# Patient Record
Sex: Male | Born: 1980
Health system: Southern US, Community
[De-identification: ages and names within clinical notes are randomized; demographics above are authoritative.]

---

## 2003-05-05 ENCOUNTER — Emergency Department (HOSPITAL_COMMUNITY): Admission: EM | Admit: 2003-05-05 | Discharge: 2003-05-05 | Payer: Self-pay | Admitting: Emergency Medicine

## 2003-05-05 ENCOUNTER — Encounter: Payer: Self-pay | Admitting: Emergency Medicine

## 2009-06-16 ENCOUNTER — Encounter (INDEPENDENT_AMBULATORY_CARE_PROVIDER_SITE_OTHER): Payer: Self-pay | Admitting: *Deleted

## 2012-06-14 ENCOUNTER — Encounter: Payer: Self-pay | Admitting: Family Medicine

## 2012-06-14 ENCOUNTER — Ambulatory Visit (INDEPENDENT_AMBULATORY_CARE_PROVIDER_SITE_OTHER): Payer: BC Managed Care – PPO | Admitting: Family Medicine

## 2012-06-14 VITALS — BP 136/102 | HR 98 | Temp 98.8°F | Resp 18 | Ht 68.5 in | Wt 161.2 lb

## 2012-06-14 DIAGNOSIS — E860 Dehydration: Secondary | ICD-10-CM

## 2012-06-14 DIAGNOSIS — F329 Major depressive disorder, single episode, unspecified: Secondary | ICD-10-CM

## 2012-06-14 DIAGNOSIS — R1110 Vomiting, unspecified: Secondary | ICD-10-CM

## 2012-06-14 DIAGNOSIS — F323 Major depressive disorder, single episode, severe with psychotic features: Secondary | ICD-10-CM

## 2012-06-14 LAB — POCT CBC
Granulocyte percent: 59.4 %G (ref 37–80)
Hemoglobin: 15.2 g/dL (ref 14.1–18.1)
Lymph, poc: 1.7 (ref 0.6–3.4)
MCH, POC: 22.1 pg — AB (ref 27–31.2)
MCHC: 30.8 g/dL — AB (ref 31.8–35.4)
MCV: 71.9 fL — AB (ref 80–97)
MPV: 10.2 fL (ref 0–99.8)
POC Granulocyte: 3.1 (ref 2–6.9)
RBC: 6.87 M/uL — AB (ref 4.69–6.13)
WBC: 5.3 10*3/uL (ref 4.6–10.2)

## 2012-06-14 LAB — POCT URINALYSIS DIPSTICK
Bilirubin, UA: NEGATIVE
Blood, UA: NEGATIVE
Leukocytes, UA: NEGATIVE
Protein, UA: 100
Spec Grav, UA: 1.02
Urobilinogen, UA: 0.2
pH, UA: 7.5

## 2012-06-14 LAB — POCT UA - MICROSCOPIC ONLY

## 2012-06-14 LAB — TSH: TSH: 2.081 u[IU]/mL (ref 0.350–4.500)

## 2012-06-14 LAB — COMPREHENSIVE METABOLIC PANEL
Albumin: 5 g/dL (ref 3.5–5.2)
BUN: 10 mg/dL (ref 6–23)
CO2: 26 mEq/L (ref 19–32)
Calcium: 9.8 mg/dL (ref 8.4–10.5)
Chloride: 96 mEq/L (ref 96–112)
Sodium: 138 mEq/L (ref 135–145)

## 2012-06-14 LAB — AMYLASE: Amylase: 34 U/L (ref 0–105)

## 2012-06-14 MED ORDER — ONDANSETRON 8 MG PO TBDP: 8.0000 mg | ORAL_TABLET | Freq: Three times a day (TID) | ORAL | Status: DC | PRN

## 2012-06-14 MED ORDER — ONDANSETRON 4 MG PO TBDP
8.0000 mg | ORAL_TABLET | Freq: Once | ORAL | Status: AC
  Administered 2012-06-14: 8 mg via ORAL

## 2012-06-14 MED ORDER — SERTRALINE HCL 50 MG PO TABS: 50.0000 mg | ORAL_TABLET | Freq: Every day | ORAL | Status: DC

## 2012-06-14 MED ORDER — PROMETHAZINE HCL 25 MG PO TABS: 25.0000 mg | ORAL_TABLET | Freq: Three times a day (TID) | ORAL | Status: DC | PRN

## 2012-06-14 NOTE — Patient Instructions (Addendum)
1. Vomiting  POCT urinalysis dipstick, POCT UA - Microscopic Only, POCT CBC, Comprehensive metabolic panel, Amylase, Lipase, ondansetron (ZOFRAN-ODT) disintegrating tablet 8 mg, promethazine (PHENERGAN) 25 MG tablet, DISCONTINUED: ondansetron (ZOFRAN-ODT) 8 MG disintegrating tablet  2. Depression, psychotic  TSH, Ambulatory referral to Psychiatry, Ambulatory referral to Psychology, sertraline (ZOLOFT) 50 MG tablet  3. Dehydration     Dehydration, Adult Dehydration is when you lose more fluids from the body than you take in. Vital organs like the kidneys, brain, and heart cannot function without a proper amount of fluids and salt. Any loss of fluids from the body can cause dehydration.  CAUSES   Vomiting.   Diarrhea.   Excessive sweating.   Excessive urine output.   Fever.  SYMPTOMS  Mild dehydration  Thirst.   Dry lips.   Slightly dry mouth.  Moderate dehydration  Very dry mouth.   Sunken eyes.   Skin does not bounce back quickly when lightly pinched and released.   Dark urine and decreased urine production.   Decreased tear production.   Headache.  Severe dehydration  Very dry mouth.   Extreme thirst.   Rapid, weak pulse (more than 100 beats per minute at rest).   Cold hands and feet.   Not able to sweat in spite of heat and temperature.   Rapid breathing.   Blue lips.   Confusion and lethargy.   Difficulty being awakened.   Minimal urine production.   No tears.  DIAGNOSIS  Your caregiver will diagnose dehydration based on your symptoms and your exam. Blood and urine tests will help confirm the diagnosis. The diagnostic evaluation should also identify the cause of dehydration. TREATMENT  Treatment of mild or moderate dehydration can often be done at home by increasing the amount of fluids that you drink. It is best to drink small amounts of fluid more often. Drinking too much at one time can make vomiting worse. Refer to the home care instructions  below. Severe dehydration needs to be treated at the hospital where you will probably be given intravenous (IV) fluids that contain water and electrolytes. HOME CARE INSTRUCTIONS   Ask your caregiver about specific rehydration instructions.   Drink enough fluids to keep your urine clear or pale yellow.   Drink small amounts frequently if you have nausea and vomiting.   Eat as you normally do.   Avoid:   Foods or drinks high in sugar.   Carbonated drinks.   Juice.   Extremely hot or cold fluids.   Drinks with caffeine.   Fatty, greasy foods.   Alcohol.   Tobacco.   Overeating.   Gelatin desserts.   Wash your hands well to avoid spreading bacteria and viruses.   Only take over-the-counter or prescription medicines for pain, discomfort, or fever as directed by your caregiver.   Ask your caregiver if you should continue all prescribed and over-the-counter medicines.   Keep all follow-up appointments with your caregiver.  SEEK MEDICAL CARE IF:  You have abdominal pain and it increases or stays in one area (localizes).   You have a rash, stiff neck, or severe headache.   You are irritable, sleepy, or difficult to awaken.   You are weak, dizzy, or extremely thirsty.  SEEK IMMEDIATE MEDICAL CARE IF:   You are unable to keep fluids down or you get worse despite treatment.   You have frequent episodes of vomiting or diarrhea.   You have blood or green matter (bile) in your vomit.   You  have blood in your stool or your stool looks black and tarry.   You have not urinated in 6 to 8 hours, or you have only urinated a small amount of very dark urine.   You have a fever.   You faint.  MAKE SURE YOU:   Understand these instructions.   Will watch your condition.   Will get help right away if you are not doing well or get worse.  Document Released: 10/18/2005 Document Revised: 10/07/2011 Document Reviewed: 06/07/2011 [facility] Patient Information 2012 [place],  [place].

## 2012-06-14 NOTE — Progress Notes (Signed)
Subjective:    Patient ID: Brandon Hayes, male    DOB: 1981/08/25, 31 y.o.   MRN: [id number]  HPIThis 31 y.o. male presents for evaluation of vomiting.  Onset two days ago.  No fever but +chills/sweats.  Mild headache.  No sore throat.  Vomited x multiple.  Vomited x 4 on first night; yesterday vomited x 5; today x 1 today.  Vomitus yellow; non-bloody.  No diarrhea.  Mild abdominal pain.  No urinary symptoms; no hematuria; no dysuria.  Heart racing; arms tingling on way to office; unable to open hands on way to office.  Mild dizziness.  Very weak.  Missed work yesterday; never misses work.  Has drunk 1/2 bottle water this morning; no fluid intake yesterday at all.  Last urine output this am at 8:00am small amount.  Works as Investment banker, operational at [employer]; lots of fluid.  No concern for food poisoning; no foreign travel; no camping. Wife wants pt to mention --- transitions at work; absolutely miserable; hearing voices.  Drinking 4-5 beers per night; skips Sundays.  No drugs.  No tobacco.   Depression: onset of hallucinations/hearing voices three weeks ago; saw picture on playstation; saw picture of wife's Dad and heard voices talking to him.  Not self.  +Losing it.  No SI/HI.  Hears grandfather.  No visual hallucinations.  Growing up grandfather called [employer].  No history of anxiety or depression.  No history of panic attacks but two times in past feeling like choking.  Went to [employer] and started pancking; unable to find wife nad felt like choking.  Duration six months.  Has job offers all the time.  Current employment duration seven years.  Relationship with wife is great; infertility x 2 years.  Wife starting medication for infertility.    No previous history of anxiety or depression.  Voices are not telling patient to harm self or others; just hearing grandfather talking to him regularly.  Work environment very stressful.   PMH:  None Psurg: none Medications:  Fish Oil, Vitamin C. 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   Review of Systems  Constitutional: Positive for chills, activity change, appetite change and fatigue. Negative for fever and unexpected weight change.  HENT: Negative for congestion, rhinorrhea, sneezing and ear discharge.   Eyes: Negative for photophobia and visual disturbance.  Respiratory: Negative for cough and shortness of breath.   Cardiovascular: Positive for palpitations. Negative for chest pain.  Gastrointestinal: Positive for nausea and vomiting. Negative for abdominal pain, diarrhea, constipation, blood in stool, abdominal distention and rectal pain.  Genitourinary: Positive for decreased urine volume. Negative for urgency, frequency, hematuria and flank pain.  Skin: Negative for rash.  Psychiatric/Behavioral: Positive for hallucinations, disturbed wake/sleep cycle and dysphoric mood. Negative for suicidal ideas, confusion, self-injury, decreased concentration and agitation. The patient is nervous/anxious.     History reviewed. No pertinent past medical history.  History reviewed. No pertinent past surgical history.  Prior to Admission medications   Medication Sig Start Date End Date Taking? Authorizing Provider  promethazine (PHENERGAN) 25 MG tablet Take 1 tablet (25 mg total) by mouth every 8 (eight) hours as needed for nausea. 06/14/12 06/21/12  Ethelda Chick, MD  sertraline (ZOLOFT) 50 MG tablet Take 1 tablet (50 mg total) by mouth daily. 06/14/12 06/14/13  Ethelda Chick, MD    No Known Allergies  History   Social History  . Marital Status:  Married    Spouse Name: N/A    Number of Children: N/A  . Years of Education: N/A   Occupational History  . Not on file.   Social History Main Topics  . Smoking status: Never Smoker   . Smokeless tobacco: Not on file  . Alcohol Use: 21.6 oz/week    36 Cans of beer per week  . Drug Use: No  . Sexually Active: Yes    Birth  Control/ Protection: None   Other Topics Concern  . Not on file   Social History Narrative   Married.  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    History reviewed. No pertinent family history.    Objective:   Physical Exam  Nursing note and vitals reviewed. Constitutional: He is oriented to person, place, and time. He appears well-developed and well-nourished. No distress.  HENT:  Head: Normocephalic and atraumatic.  Mouth/Throat: Oropharynx is clear and moist. No oropharyngeal exudate.  Eyes: Conjunctivae and EOM are normal. Pupils are equal, round, and reactive to light. No scleral icterus.  Neck: Normal range of motion. Neck supple. No thyromegaly present.  Cardiovascular: Normal rate, regular rhythm, normal heart sounds and intact distal pulses.  Exam reveals no gallop and no friction rub.   No murmur heard.      Pulse 98.  Pulmonary/Chest: Effort normal and breath sounds normal. No respiratory distress.  Abdominal: Soft. Bowel sounds are normal. He exhibits no distension and no mass. There is no tenderness. There is no rebound and no guarding.  Lymphadenopathy:    He has no cervical adenopathy.  Neurological: He is alert and oriented to person, place, and time.  Skin: Skin is warm and dry. He is not diaphoretic.  Psychiatric: He has a normal mood and affect. His behavior is normal. Judgment and thought content normal.          Results for orders placed in visit on 06/14/12  POCT URINALYSIS DIPSTICK      Component Value Range   Color, UA amber     Clarity, UA clear     Glucose, UA neg     Bilirubin, UA neg     Ketones, UA 15     Spec Grav, UA 1.020     Blood, UA neg     pH, UA 7.5     Protein, UA 100     Urobilinogen, UA 0.2     Nitrite, UA neg     Leukocytes, UA Negative    POCT UA -  MICROSCOPIC ONLY      Component Value Range   WBC, Ur, HPF, POC 0-2     RBC, urine, microscopic 0-1     Bacteria, U Microscopic trace     Mucus, UA small     Epithelial cells, urine per micros 1-2     Crystals, Ur, HPF, POC neg     Casts, Ur, LPF, POC hyaline     Yeast, UA neg    POCT CBC      Component Value Range   WBC 5.3  4.6 - 10.2 K/uL   Lymph, poc 1.7  0.6 - 3.4   POC LYMPH PERCENT 32.6  10 - 50 %L   MID (cbc) 0.4  0 - 0.9   POC MID % 8.0  0 - 12 %M   POC Granulocyte 3.1  2 - 6.9   Granulocyte percent 59.4  37 - 80 %G   RBC 6.87 (*) 4.69 - 6.13 M/uL   Hemoglobin 15.2  14.1 - 18.1 g/dL   HCT, POC 16.1  09.6 - 53.7 %   MCV 71.9 (*) 80 - 97 fL   MCH, POC 22.1 (*) 27 - 31.2 pg   MCHC 30.8 (*) 31.8 - 35.4 g/dL   RDW, POC 04.5     Platelet Count, POC 260  142 - 424 K/uL   MPV 10.2  0 - 99.8 fL   Assessment & Plan:   1. Vomiting  POCT urinalysis dipstick, POCT UA - Microscopic Only, POCT CBC, Comprehensive metabolic panel, Amylase, Lipase, ondansetron (ZOFRAN-ODT) disintegrating tablet 8 mg, promethazine (PHENERGAN) 25 MG tablet, DISCONTINUED: ondansetron (ZOFRAN-ODT) 8 MG disintegrating tablet  2. Depression, psychotic  TSH, Ambulatory referral to Psychiatry, Ambulatory referral to Psychology, sertraline (ZOLOFT) 50 MG tablet  3. Dehydration    1.  Vomiting:  New.  Onset 72 hours ago.  S/p Zofran 8mg  SL in office with no recurrent vomiting.  Rx for Phenergan provided.  BRAT diet, hydration.  RTC inability to hydrate orally.  Benign abdominal exam.  Labs obtained. 2.  Dehydration:  New.  Secondary to vomiting.  S/p Zofran in office.  Observed in office; drank 24 ounces water, apple juice without vomiting.  Orthostatics stable.  If does not significantly improve in upcoming 24 hours, to RTC for iv hydration.  Labs obtained. 3.  Depression with psychosis: new. Onset of auditory hallucinations in past 3 weeks; major work stressors.  Rx for Zoloft provided to start today; refer to  psychiatry and intensive outpatient psychotherapy.  Contracted safety with patient.

## 2012-06-15 ENCOUNTER — Encounter: Payer: Self-pay | Admitting: Family Medicine

## 2012-06-16 ENCOUNTER — Other Ambulatory Visit: Payer: Self-pay | Admitting: Family Medicine

## 2012-06-16 ENCOUNTER — Ambulatory Visit (INDEPENDENT_AMBULATORY_CARE_PROVIDER_SITE_OTHER): Payer: BC Managed Care – PPO | Admitting: Emergency Medicine

## 2012-06-16 VITALS — BP 153/102 | HR 105 | Temp 98.0°F | Resp 20 | Ht 67.0 in | Wt 161.0 lb

## 2012-06-16 DIAGNOSIS — R7989 Other specified abnormal findings of blood chemistry: Secondary | ICD-10-CM

## 2012-06-16 DIAGNOSIS — R209 Unspecified disturbances of skin sensation: Secondary | ICD-10-CM

## 2012-06-16 DIAGNOSIS — IMO0001 Reserved for inherently not codable concepts without codable children: Secondary | ICD-10-CM

## 2012-06-16 DIAGNOSIS — F419 Anxiety disorder, unspecified: Secondary | ICD-10-CM

## 2012-06-16 DIAGNOSIS — F411 Generalized anxiety disorder: Secondary | ICD-10-CM

## 2012-06-16 DIAGNOSIS — F1010 Alcohol abuse, uncomplicated: Secondary | ICD-10-CM

## 2012-06-16 DIAGNOSIS — R202 Paresthesia of skin: Secondary | ICD-10-CM

## 2012-06-16 DIAGNOSIS — G4700 Insomnia, unspecified: Secondary | ICD-10-CM

## 2012-06-16 DIAGNOSIS — R112 Nausea with vomiting, unspecified: Secondary | ICD-10-CM

## 2012-06-16 LAB — POCT CBC
Lymph, poc: 1.6 (ref 0.6–3.4)
MCH, POC: 21.9 pg — AB (ref 27–31.2)
MCHC: 30.3 g/dL — AB (ref 31.8–35.4)
MPV: 10.3 fL (ref 0–99.8)
POC Granulocyte: 3.3 (ref 2–6.9)
POC LYMPH PERCENT: 30.1 %L (ref 10–50)
POC MID %: 9.3 %M (ref 0–12)
RDW, POC: 16.6 %
WBC: 5.4 10*3/uL (ref 4.6–10.2)

## 2012-06-16 LAB — FOLATE RBC

## 2012-06-16 LAB — COMPREHENSIVE METABOLIC PANEL
ALT: 56 U/L — ABNORMAL HIGH (ref 0–53)
Alkaline Phosphatase: 41 U/L (ref 39–117)
Creat: 0.57 mg/dL (ref 0.50–1.35)
Glucose, Bld: 96 mg/dL (ref 70–99)
Sodium: 136 mEq/L (ref 135–145)
Total Bilirubin: 1.3 mg/dL — ABNORMAL HIGH (ref 0.3–1.2)
Total Protein: 7.9 g/dL (ref 6.0–8.3)

## 2012-06-16 MED ORDER — LORAZEPAM 0.5 MG PO TABS: ORAL_TABLET | ORAL | Status: DC

## 2012-06-16 NOTE — Progress Notes (Signed)
Subjective:    Patient ID: Brandon Hayes, male    DOB: [birth year], 31 y.o.   MRN: [id number]  HPI This 31 y.o. male presents for evaluation 48 hour follow-up for the following:  1.  Nausea/vomiting:  Vomiting much improved after last visit 48 hours ago; minimal nausea yesterday; ate baked chicken one piece, water and gatorade; + 3 pints of water.  +Vodka last night; 1/5 and a beer.  Drinks every night for insomnia.  Vomited in shower this morning; first episode of vomiting in 48 hours.  Ate a chicken sandwich last night.   Worked yesterday; went into work at [employer]; was tired.  Left work around 6:00pm.  Has been overly tired.  Was in bed for five days in past week.  Now still very tired.   LFTs elevated on labs at visit 48 hours ago; amylase and lipase normal; WBC count normal. 2.  Alcohol intake: usually drinks 1/5 of liquor nightly except on Sundays; has worsened in past several months (six).   Starts drinking around 4:00pm on empty stomach; usually will pass out at night around 7:00pm.  Previously hospitalized for pancreatitis from alcohol; major partier at that time; would drink 2 fifths at night; duration one year; age 12.  No alcoholism in family; parents do not drink.  Wife gets home around 6:00pm to drunk husband who passes out within the hour.  Drinking due to insomnia; feels that if he could sleep he would not need to drink.  Did not drink after visit two days ago, did not sleep all night.  Slept during the day after visit due to Phenergan use. 3.  Anxiety/depression:  Lots of work stressors.  Infertility issues with wife for two years.  Schedule has worsened at work.  Everything negative; nothing positive to day.  Hearing voices for past 3 weeks; not negative voices.  Married x 9 years.  Panic attack this week.  Wife with depression/anxiety/panic attacks.  Isolating self.  Does not want to be around family or friends.  Wants to stay at home.  Felt weird this morning upon awakening; felt very  different.  Wife does not think that medication is causing current symptoms; pt thinks that current symptoms due to anxiety related to work.  Looked ghostly this morning upon awakening; felt very weird; did not want to talk to wife.  Told wife that she had to quit worrying.  Not sure if medication cause of feelings/emotions today.  Drive to work was [employer]; felt tunneling.  Went in at 10:00am; felt different but was determined to work through it.  At noon, felt like was going to fall and developed tingling in fingers and legs.  Everyone is very concerned about him; coworkers very worried.  Now embarrassed.  Tingling now better.  Became very clammy. Started shaking.  Started crying out of nowhere.  Does not want to feel like this anymore.  Has rage modes and hit something while drinking.  Yells at wife when she gets home.    Denies SI/HI today. Wife has never seen him like this.. 4.  Tingling:  Occurred on way to visit 48 hours ago and improved.  Recurred today when at work and crying.  Continued until presenting here; now much improved; when wife picked up patient at work, he was breathing shallow rapid breaths.  No associated headache, focal weakness, incontinence, visual changes, seizure activity.     Review of Systems  Constitutional: Positive for fever and activity change. Negative for chills, diaphoresis and  appetite change.  Eyes: Negative for visual disturbance.  Respiratory: Positive for choking. Negative for chest tightness, shortness of breath, wheezing and stridor.   Cardiovascular: Negative for chest pain and palpitations.  Gastrointestinal: Positive for nausea and vomiting. Negative for abdominal pain, diarrhea, constipation, blood in stool and abdominal distention.  Skin: Negative for rash.  Neurological: Positive for numbness. Negative for dizziness, tremors, seizures, syncope, facial asymmetry, speech difficulty, weakness, light-headedness and headaches.  Psychiatric/Behavioral: Positive for  hallucinations, disturbed wake/sleep cycle, dysphoric mood and agitation. Negative for suicidal ideas, behavioral problems, confusion, self-injury and decreased concentration. The patient is nervous/anxious. The patient is not hyperactive.     No past medical history on file.  No past surgical history on file.  Prior to Admission medications   Medication Sig Start Date End Date Taking? Authorizing Provider  promethazine (PHENERGAN) 25 MG tablet Take 1 tablet (25 mg total) by mouth every 8 (eight) hours as needed for nausea. 06/14/12 06/21/12 Yes Ethelda Chick, MD  sertraline (ZOLOFT) 50 MG tablet Take 1 tablet (50 mg total) by mouth daily. 06/14/12 06/14/13 Yes Ethelda Chick, MD  LORazepam (ATIVAN) 0.5 MG tablet 1-2 tablets three times daily PRN 06/16/12   Ethelda Chick, MD    No Known Allergies  History   Social History  . Marital Status: Married    Spouse Name: N/A    Number of Children: N/A  . Years of Education: N/A   Occupational History  . Not on file.   Social History Main Topics  . Smoking status: Never Smoker   . Smokeless tobacco: Not on file  . Alcohol Use: 21.6 oz/week    36 Cans of beer per week  . Drug Use: No  . Sexually Active: Yes    Birth Control/ Protection: None   Other Topics Concern  . Not on file   Social History Narrative   Married.  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    No family history on file.    Objective:   Physical Exam  Constitutional: He is oriented to person, place, and time. He appears well-developed and well-nourished. He appears distressed.       MILD DISTRESS.  HENT:  Head: Normocephalic and atraumatic.  Eyes: Conjunctivae and EOM are normal. Pupils are equal, round, and reactive to light.  Neck: Normal range of motion. Neck supple.  Cardiovascular:  Normal rate, regular rhythm, normal heart sounds and intact distal pulses.  Exam reveals no gallop and no friction rub.   No murmur heard. Pulmonary/Chest: Effort normal and breath sounds normal. No respiratory distress. He has no wheezes. He has no rales. He exhibits no tenderness.  Abdominal: Soft. Bowel sounds are normal. He exhibits no distension and no mass. There is no tenderness. There is no rebound and no guarding.  Musculoskeletal: Normal range of motion.  Neurological: He is alert and oriented to person, place, and time. No cranial nerve deficit. He exhibits normal muscle tone. Coordination normal.  Skin: Skin is warm and dry. He is not diaphoretic.  Psychiatric: His behavior is normal. Judgment and thought content normal.       AFFECT FLAT.     Results for orders placed in visit on 06/16/12  POCT CBC      Component Value Range   WBC 5.4  4.6 - 10.2 K/uL   Lymph,  poc 1.6  0.6 - 3.4   POC LYMPH PERCENT 30.1  10 - 50 %L   MID (cbc) 0.5  0 - 0.9   POC MID % 9.3  0 - 12 %M   POC Granulocyte 3.3  2 - 6.9   Granulocyte percent 60.6  37 - 80 %G   RBC 6.47 (*) 4.69 - 6.13 M/uL   Hemoglobin 14.2  14.1 - 18.1 g/dL   HCT, POC 16.1  09.6 - 53.7 %   MCV 72.4 (*) 80 - 97 fL   MCH, POC 21.9 (*) 27 - 31.2 pg   MCHC 30.3 (*) 31.8 - 35.4 g/dL   RDW, POC 04.5     Platelet Count, POC 213  142 - 424 K/uL   MPV 10.3  0 - 99.8 fL  FOLATE RBC      Component Value Range   RBC Folate TEST NOT PERFORMED  >=366 ng/mL       Assessment & Plan:   1. Nausea & vomiting  POCT CBC, Comprehensive metabolic panel  2. Paresthesias  Vitamin B12, Folate RBC  3. Anxiety  LORazepam (ATIVAN) 0.5 MG tablet  4.  Elevated blood pressure without diagnosis. 5.  Alcohol abuse.   1.  Nausea and vomiting: improved from 48 hours ago; good hydration status today.  Good po intake. 2.  Anxiety/depression with psychosis:  Uncontrolled; continue Zoloft 50mg  daily; will add Ativan 0.5mg  1-2 tid scheduled for next  several weeks for anxiety and for alcohol withdrawal treatment.  Close follow-up in 4 days.  List of psychiatrist (4) and psychologist (4) provided; to call for appointments with both; contracted safety. 3.  Paresthesias: recurrent; consistent with panic attack with hyperventilation related to anxiety; normal neurological exam in office.  Obtain labs including B12 and folate. 4.  Alcohol Abuse/Misuse: new.  Duration six months; feels can stop drinking if insomnia treated.  Will treat with Ativan 0.5mg  two at bedtime.  Will also prescribe Ativan scheduled over next weeks for alcohol withdrawal treatment/avoidance.  RTC for agitation, fever, etc.  Pt expressed understanding.  History of pancreatitis age 79. 5.  Elevated LFTs: new.  Recent labs; likely secondary to alcohol abuse; repeat today.  6.  Elevated blood pressure without diagnosis:  New.  Follow-up in upcoming week for repeat BP.   7. Insomnia: New. Treat with Ativan at this time.  Secondary to major depression with anxiety.    Meds ordered this encounter  Medications  . LORazepam (ATIVAN) 0.5 MG tablet    Sig: 1-2 tablets three times daily PRN    Dispense:  40 tablet    Refill:  0

## 2012-06-17 ENCOUNTER — Telehealth: Payer: Self-pay

## 2012-06-17 LAB — VITAMIN B12: Vitamin B-12: 643 pg/mL (ref 211–911)

## 2012-06-17 NOTE — Telephone Encounter (Signed)
THIS SPECIMEN WAS A CRITICAL FROZEN AND WAS NOT SENT OUT CORRECTLY.  ADVISED PT WIFE ABOUT THIS.  I THINK IT WOULD BE BEST IF PT WENT TO DRAWING STATION TO GET THIS DONE APPROPRIATELY.  PT WIFE AGREES IF NECESSARY TO DO SO.  PLEASE SEND TO LAB POOL IF YOU WOULD LIKE FOR [place] TO DO A REQ AND CONTACT PATIENT.

## 2012-06-17 NOTE — Telephone Encounter (Signed)
THIS CALL WAS FROM [facility]. DR. [name] WROTE AN ORDER THAT THEY CANNOT PERFORM. THEY NEED TO SPEAK WITH SOMEONE REGARDING THIS PLEASE.  BEST PHONE [phone]  OPTION #2   REFERENCE# [id number]  (THE ORDER WAS WRITTEN ON FRI. 06/16/12.  MBC

## 2012-06-19 NOTE — Telephone Encounter (Signed)
Per Dr [name] add on Folate.    Test added by [place] Citizen at [facility].  Pt notified

## 2012-06-20 ENCOUNTER — Ambulatory Visit (INDEPENDENT_AMBULATORY_CARE_PROVIDER_SITE_OTHER): Payer: BC Managed Care – PPO | Admitting: Family Medicine

## 2012-06-20 VITALS — BP 117/78 | HR 64 | Temp 98.4°F | Resp 16 | Ht 68.5 in | Wt 164.0 lb

## 2012-06-20 DIAGNOSIS — F419 Anxiety disorder, unspecified: Secondary | ICD-10-CM

## 2012-06-20 DIAGNOSIS — G4700 Insomnia, unspecified: Secondary | ICD-10-CM

## 2012-06-20 DIAGNOSIS — R945 Abnormal results of liver function studies: Secondary | ICD-10-CM

## 2012-06-20 DIAGNOSIS — F1020 Alcohol dependence, uncomplicated: Secondary | ICD-10-CM

## 2012-06-20 DIAGNOSIS — F411 Generalized anxiety disorder: Secondary | ICD-10-CM

## 2012-06-20 DIAGNOSIS — R443 Hallucinations, unspecified: Secondary | ICD-10-CM

## 2012-06-20 NOTE — Progress Notes (Signed)
Subjective:    Patient ID: Brandon Hayes, male    DOB: 12-Apr-1981, 31 y.o.   MRN: [id number]  HPIThis 31 y.o. male presents for  4 day follow-up for the following:  1. Anxiety/depression:  Had a good four days.  Wonderful to have the weekend off.  Went to [employer]; took a walk; went to [facility].  [employer] Zoloft one qhs without difficulties; has been taking Ativan tid.  Took one Ativan at bedtime for three nights; did take two Ativan at bedtime once since last visit.   still very tired upon awakening.  Taking Ativan three times per day; does not make pt drowsy.  Feels like Ativan makes boring.  Felt like friend did not have a good time.  Has been very loving; very hugging and holding hands.  Has not affected sex drive at all.  Did have anxiety this morning about going to work; did not feel weird.  Also nervous about returning; coworkers have been texting.  Felt very embarrassed on Friday due to panic attack at work.  Wife is getting push back by patient on work schedule; scheduled 10-8 today.  Expected to work 50 hours per week.  Good support.  Called Dr. Audrie Lia for counseling; appointment on Monday 07/10/12 at 11:30am.  Called Dr. Evelene Croon 8 weeks out; also have Saturday appointments.  Doing well.  Denies SI/HI.  Denies further hallucinations since last visit.  Has been sleeping really well with Ativan qhs.  2.  Alcohol abuse: no alcohol since last visit.  Has been sitting in different rooms of the house; usually will drink in bed; wife afraid to go out and do things because she does not want pt to drink.  Has been wanting to hang out with wife which is a drastic improvement/change.  Usually drinks in bed.  Wife very proud of pt for not drinking.  Wants to change behavior; has been eating like crazy.  Now eating instead of drinking.  Drinking a lot of water during the day.  Has not been too difficult not to drink at night. Did really want to drink a few beers with friend at lunch yesterday but did not; felt very  boring.  Plans on drinking this weekend on Sunday; plans to have friends over.   Wife usually will drink with patient.  Denies HA, fever, tachycardia, agitation, tremors without alcohol for past four days.  3.  Paresthesias: improved; no recurrence; Vitamin B12 normal; thyroid function normal; folate level pending.  4. Elevated liver function tests:  Slightly improved from initial labs.       Review of Systems  Constitutional: Positive for fatigue. Negative for fever, chills and diaphoresis.  Respiratory: Negative for chest tightness, shortness of breath and wheezing.   Cardiovascular: Negative for chest pain, palpitations and leg swelling.  Gastrointestinal: Negative for nausea, vomiting, abdominal pain and diarrhea.  Psychiatric/Behavioral: Positive for disturbed wake/sleep cycle and dysphoric mood. Negative for suicidal ideas, hallucinations, behavioral problems, confusion and agitation. The patient is nervous/anxious.        Objective:   Physical Exam  Nursing note and vitals reviewed. Constitutional: He is oriented to person, place, and time. He appears well-developed and well-nourished.  HENT:  Head: Normocephalic and atraumatic.  Right Ear: External ear normal.  Left Ear: External ear normal.  Nose: Nose normal.  Mouth/Throat: Oropharynx is clear and moist.  Eyes: Conjunctivae and EOM are normal. Pupils are equal, round, and reactive to light.  Neck: Normal range of motion. Neck supple.  Cardiovascular: Normal  rate, regular rhythm, normal heart sounds and intact distal pulses.   Pulmonary/Chest: Effort normal and breath sounds normal.  Neurological: He is alert and oriented to person, place, and time. He has normal reflexes. No cranial nerve deficit. He exhibits normal muscle tone.  Skin: Skin is warm and dry.  Psychiatric: He has a normal mood and affect. His behavior is normal. Judgment and thought content normal.          Assessment & Plan:   1. Anxiety   2.  Insomnia   3. Hallucinations   4. Alcoholism   5. Liver function test abnormality    1.  Anxiety with depression: improved with scheduled Ativan tid; tolerating Zoloft 50mg  qhs.  Appointment 07/10/12 with Dr. Bascom Levels of psychology; to call psychiatry office back for appointment.  To follow-up [facility] in two to three weeks.  No change in medication today. 2. Insomnia: improved with Ativan qhs.  No change in medications today. 3.  Hallucinations Auditory: improved with treatment of insomnia, anxiety, alcoholism. 4.  Alcoholism: improving yet patient still desires to drink socially; counseled extensively and cautioned regarding drinking.  Advised to drink with caution; if truly self medicating with alcohol to treat anxiety, then may be able to drink in future but concern for underlying alcoholism. 5. Liver function test elevated: persistent but improved; will repeat at follow-up visit in 2-3 weeks; alcohol avoidance. If persists, will warrant abdominal u/s and GI referral.

## 2012-06-22 NOTE — Progress Notes (Signed)
Reviewed and agree.

## 2012-06-23 NOTE — Progress Notes (Signed)
Reviewed and agree.

## 2012-06-26 ENCOUNTER — Telehealth: Payer: Self-pay

## 2012-06-26 DIAGNOSIS — F419 Anxiety disorder, unspecified: Secondary | ICD-10-CM

## 2012-06-26 MED ORDER — LORAZEPAM 0.5 MG PO TABS: ORAL_TABLET | ORAL | Status: DC

## 2012-06-26 NOTE — Telephone Encounter (Signed)
Called wife sent this to [facility]

## 2012-06-26 NOTE — Telephone Encounter (Signed)
Brandon Hayes STATES DR [name] TOLD HER IF HER HUSBAND RUN OUT OF HIS LORAZEPAM TO GIVE HER A CALL AND WE WOULD CALL MORE IN FOR HIM, HE WON'T BE ABLE TO HAVE AN APPT WITH THE OTHER DR [name] November, WOULD LIKE TO HAVE MORE THAN THE 3O HE NORMALLY GETS. NEED TO HAVE THIS TODAY, HE CANNOT RUN OUT OF HIS MEDS PLEASE CALL [phone]     [facility] ON [address]

## 2012-06-26 NOTE — Telephone Encounter (Signed)
Rx printed for #60.

## 2012-07-10 ENCOUNTER — Telehealth: Payer: Self-pay

## 2012-07-10 ENCOUNTER — Ambulatory Visit (INDEPENDENT_AMBULATORY_CARE_PROVIDER_SITE_OTHER): Payer: BC Managed Care – PPO | Admitting: Family Medicine

## 2012-07-10 VITALS — BP 116/88 | HR 61 | Temp 98.0°F | Resp 16 | Ht 68.0 in | Wt 162.0 lb

## 2012-07-10 DIAGNOSIS — F1010 Alcohol abuse, uncomplicated: Secondary | ICD-10-CM

## 2012-07-10 DIAGNOSIS — R945 Abnormal results of liver function studies: Secondary | ICD-10-CM

## 2012-07-10 DIAGNOSIS — F329 Major depressive disorder, single episode, unspecified: Secondary | ICD-10-CM

## 2012-07-10 DIAGNOSIS — G4700 Insomnia, unspecified: Secondary | ICD-10-CM

## 2012-07-10 DIAGNOSIS — F323 Major depressive disorder, single episode, severe with psychotic features: Secondary | ICD-10-CM

## 2012-07-10 DIAGNOSIS — F411 Generalized anxiety disorder: Secondary | ICD-10-CM

## 2012-07-10 DIAGNOSIS — F419 Anxiety disorder, unspecified: Secondary | ICD-10-CM

## 2012-07-10 DIAGNOSIS — Z23 Encounter for immunization: Secondary | ICD-10-CM

## 2012-07-10 DIAGNOSIS — R7989 Other specified abnormal findings of blood chemistry: Secondary | ICD-10-CM

## 2012-07-10 LAB — COMPREHENSIVE METABOLIC PANEL
AST: 32 U/L (ref 0–37)
Alkaline Phosphatase: 42 U/L (ref 39–117)
BUN: 11 mg/dL (ref 6–23)
Creat: 0.63 mg/dL (ref 0.50–1.35)
Total Bilirubin: 0.6 mg/dL (ref 0.3–1.2)

## 2012-07-10 LAB — CBC
HCT: 40.6 % (ref 39.0–52.0)
MCHC: 33 g/dL (ref 30.0–36.0)
MCV: 67.4 fL — ABNORMAL LOW (ref 78.0–100.0)
RDW: 17.9 % — ABNORMAL HIGH (ref 11.5–15.5)

## 2012-07-10 MED ORDER — LORAZEPAM 0.5 MG PO TABS: ORAL_TABLET | ORAL | Status: DC

## 2012-07-10 MED ORDER — SERTRALINE HCL 50 MG PO TABS: 100.0000 mg | ORAL_TABLET | Freq: Every day | ORAL | Status: DC

## 2012-07-10 NOTE — Telephone Encounter (Signed)
Called in lorazepam and sertraline to patient's pharmacy [facility]) and advised patient.

## 2012-07-10 NOTE — Progress Notes (Signed)
Subjective:    Patient ID: Brandon Hayes, male    DOB: [birth year], 31 y.o.   MRN: [id number]  HPI This 31 y.o. male presents for evaluation of anxiety/depression: 1.  Anxiety/depression:  Three week follow-up of anxiety.  Usually needs two Xanax every morning due to anxiety; does not need afternoon dose of Xanax; then takes Xanax qhs.  Sleeping really well.  Wife very proud of him.  Has appointment today at 11:30 with psychologist/counselor.  Still very loving.  Not taking Zoloft when drinking.  Filled/ 06/26/12 Lorazepam; taking 3-4 per day.  Called psychiatry; appointment 09/01/12; pt must approve appointment.   2.  Insomnia: sleeping really well with Xanax qhs.   3. Elevated LFTs: due for repeat labs. 4.  Alcoholism:  07/01/12 got extremely intoxicated.  Left out of town for beach for weekend.  Had the day off.  Then wanted to drink in am while at the beach; bought two Smirnoff's at [employer]; also bought wife's drinks.  Did really well for first week; had a great weekend.  Came home on Labor Day; had day off.  No alcohol on Monday/Labor Day.  Then, no drinking on Tuesday; did drink on Wednesday.  Wife left Thursday for [place].  Wife went one year without alcohol due to attempting pregnancy.  Last night went to dinner, wife wanted [employer]; then pt ordered double Ree Kida n coke.  Two more ordered during dinner.  Major arguing. Not arguing every time.      Review of Systems  Constitutional: Negative for fever, chills, diaphoresis and fatigue.  Gastrointestinal: Negative for nausea, vomiting, abdominal pain, diarrhea and abdominal distention.  Psychiatric/Behavioral: Negative for suicidal ideas, hallucinations, behavioral problems, confusion, disturbed wake/sleep cycle, self-injury, dysphoric mood, decreased concentration and agitation. The patient is nervous/anxious. The patient is not hyperactive.     No past medical history on file.  No past surgical history on file.  Prior to Admission  medications   Medication Sig Start Date End Date Taking? Authorizing Provider  LORazepam (ATIVAN) 0.5 MG tablet 1-2 tablets three times daily PRN 07/10/12  Yes Ethelda Chick, MD  sertraline (ZOLOFT) 50 MG tablet Take 2 tablets (100 mg total) by mouth daily. 07/10/12 07/10/13 Yes Ethelda Chick, MD  promethazine (PHENERGAN) 25 MG tablet Take 1 tablet (25 mg total) by mouth every 8 (eight) hours as needed for nausea. 06/14/12 06/21/12  Ethelda Chick, MD    No Known Allergies  History   Social History  . Marital Status: Married    Spouse Name: N/A    Number of Children: N/A  . Years of Education: N/A   Occupational History  . Not on file.   Social History Main Topics  . Smoking status: Never Smoker   . Smokeless tobacco: Not on file  . Alcohol Use: 21.6 oz/week    36 Cans of beer per week  . Drug Use: No  . Sexually Active: Yes    Birth Control/ Protection: None   Other Topics Concern  . Not on file   Social History Narrative   Married.  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    No family history on file.     Objective:   Physical Exam  Nursing note and vitals reviewed. Constitutional: He is oriented to person, place, and time. He appears well-developed and well-nourished.  HENT:  Head: Normocephalic and atraumatic.  Eyes: Conjunctivae normal are normal. Pupils are equal, round, and reactive to light.  Neck: Normal range of motion. Neck supple. No thyromegaly present.  Cardiovascular: Normal rate, regular rhythm and normal heart sounds.   No murmur heard. Pulmonary/Chest: Effort normal and breath sounds normal. He has no wheezes. He has no rales.  Abdominal: Soft. Bowel sounds are normal. He exhibits no distension and no mass. There is no tenderness.  Neurological: He is alert and oriented to person,  place, and time. No cranial nerve deficit. He exhibits normal muscle tone. Coordination normal.  Psychiatric: He has a normal mood and affect. His behavior is normal. Judgment and thought content normal.       Assessment & Plan:   1. Anxiety  LORazepam (ATIVAN) 0.5 MG tablet  2. Depression, psychotic  sertraline (ZOLOFT) 50 MG tablet  3. Insomnia    4. Alcohol abuse    5. Liver function test abnormality  CBC, Comprehensive metabolic panel  6. Flu vaccine need  Flu vaccine greater than or equal to 3yo preservative free IM     1.  Anxiety:  Improved but persistent; requiring 3-4 Ativan per day.  Increase Zoloft to 50mg  two daily.  Attempt to wean Ativan use to 1 tablet  Bid.  Appointment with counselor today.  To keep appointment with psychiatry on 09/01/12 unless can get appointment sooner.  Good family and social support.  No SI/HI.  Follow-up in one month or sooner if worse. 2. Depression psychotic:  Improved/resolved.  No further auditory hallucinations.   3. Insomnia: controlled with Zoloft and Ativan.   4.  Alcohol Abuse: improved but continues to binge when does drink.  Recommend complete cessation to patient.  Also advised wife that she should not drink in presence of patient.  Feel patient suffering with long standing alcoholism yet patient does not appear to understand the severity of his addiction at this time.  To address during psychotherapy today.   5. Liver function abnormality: Persistent/improving; due for repeat labs today. 6.  S/p flu vaccine in office.

## 2012-07-10 NOTE — Telephone Encounter (Signed)
The patient's wife called for the patient to ask if the medication reccommended by his counselor today, Naltrexone, would be an appropriate medication to take, and if so, they will need a Rx while they wait on a psychiatrist appointment.  Please call patient or patient's wife back at [phone].

## 2012-07-19 NOTE — Telephone Encounter (Signed)
Spoke w/pt and gave him message from Dr [name]. Pt agreed and thanked [place] for calling him back.

## 2012-07-19 NOTE — Telephone Encounter (Signed)
Call back -- please apologize in delay in returning call.  I am reluctant to start Naltrexone at this time since we have increased patient's Zoloft at recent visit.  We can discuss further at next appointment.  KMS

## 2012-08-14 ENCOUNTER — Ambulatory Visit: Payer: BC Managed Care – PPO | Admitting: Family Medicine

## 2012-08-26 NOTE — Progress Notes (Signed)
Reviewed and agree.

## 2016-03-02 ENCOUNTER — Encounter (HOSPITAL_COMMUNITY): Payer: Self-pay | Admitting: Emergency Medicine

## 2016-03-02 ENCOUNTER — Emergency Department (HOSPITAL_COMMUNITY): Payer: Managed Care, Other (non HMO)

## 2016-03-02 ENCOUNTER — Emergency Department (HOSPITAL_COMMUNITY)
Admission: EM | Admit: 2016-03-02 | Discharge: 2016-03-03 | Discharge status: Against Medical Advice | Payer: Managed Care, Other (non HMO) | Attending: Emergency Medicine | Admitting: Emergency Medicine

## 2016-03-02 DIAGNOSIS — Y9389 Activity, other specified: Secondary | ICD-10-CM | POA: Diagnosis not present

## 2016-03-02 DIAGNOSIS — F10129 Alcohol abuse with intoxication, unspecified: Secondary | ICD-10-CM | POA: Insufficient documentation

## 2016-03-02 DIAGNOSIS — Y998 Other external cause status: Secondary | ICD-10-CM | POA: Insufficient documentation

## 2016-03-02 DIAGNOSIS — W01198A Fall on same level from slipping, tripping and stumbling with subsequent striking against other object, initial encounter: Secondary | ICD-10-CM | POA: Insufficient documentation

## 2016-03-02 DIAGNOSIS — S50311A Abrasion of right elbow, initial encounter: Secondary | ICD-10-CM | POA: Diagnosis not present

## 2016-03-02 DIAGNOSIS — S0091XA Abrasion of unspecified part of head, initial encounter: Secondary | ICD-10-CM

## 2016-03-02 DIAGNOSIS — Y9289 Other specified places as the place of occurrence of the external cause: Secondary | ICD-10-CM | POA: Insufficient documentation

## 2016-03-02 DIAGNOSIS — S0001XA Abrasion of scalp, initial encounter: Secondary | ICD-10-CM | POA: Diagnosis not present

## 2016-03-02 DIAGNOSIS — S0990XA Unspecified injury of head, initial encounter: Secondary | ICD-10-CM | POA: Diagnosis present

## 2016-03-02 DIAGNOSIS — Z23 Encounter for immunization: Secondary | ICD-10-CM | POA: Diagnosis not present

## 2016-03-02 DIAGNOSIS — IMO0002 Reserved for concepts with insufficient information to code with codable children: Secondary | ICD-10-CM

## 2016-03-02 LAB — ETHANOL: ALCOHOL ETHYL (B): 444 mg/dL — AB (ref <5)

## 2016-03-02 MED ORDER — TETANUS-DIPHTH-ACELL PERTUSSIS 5-2.5-18.5 LF-MCG/0.5 IM SUSP
0.5000 mL | Freq: Once | INTRAMUSCULAR | Status: AC
  Administered 2016-03-02: 0.5 mL via INTRAMUSCULAR
  Filled 2016-03-02: qty 0.5

## 2016-03-02 NOTE — ED Notes (Signed)
Pt presents with [facility] for ETOH intoxication and injuries related to a fall; EMS reports pt was pulled over by police, pt attempted to get out of car and fell striking RIGHT sided head, small abrasion noted- bleeding controlled; pt denies pain at this time; EMS attempted to apply LSB and C-collar however patient did not tolerate LSB- ccollar remains; pt denies LOC

## 2016-03-03 NOTE — ED Notes (Addendum)
Unable to locate pt. Pt not in bed. PA aware

## 2016-03-03 NOTE — ED Notes (Signed)
Pt returned from CT. Resting quietly with eyes closed

## 2016-03-03 NOTE — ED Notes (Signed)
Pt continues to rest quietly with eyes closed.

## 2016-03-03 NOTE — ED Notes (Signed)
No complaints voiced, pt resting quietly with eyes closed

## 2016-03-03 NOTE — ED Provider Notes (Signed)
CSN: [id number]     Arrival date & time 03/02/16  2145 History   First MD Initiated Contact with Patient 03/02/16 2150     Chief Complaint  Patient presents with  . Alcohol Intoxication  . Fall  . Head Injury     (Consider location/radiation/quality/duration/timing/severity/associated sxs/prior Treatment) Patient is a 35 y.o. male presenting with intoxication, fall, and head injury. The history is provided by the patient. No language interpreter was used.  Alcohol Intoxication This is a new problem. The current episode started today. The problem has been unchanged. Associated symptoms include a rash. Pertinent negatives include no chest pain, fever or vomiting. Nothing aggravates the symptoms. He has tried nothing for the symptoms.  Fall Associated symptoms include a rash. Pertinent negatives include no chest pain, fever or vomiting.  Head Injury Associated symptoms: no vomiting   Pt states "I am a drunk idiot"  Per EMS.  Pt was pulled over by police and when he opened his car door he fell to the ground and hit his head.  Pt reports he is drinking because his girlfriend broke up with him.  Pt reports his girlfriend broke up with him because of his drinking.  Pt denies any complaints.  [employer] has seen him and given him tickets.   History reviewed. No pertinent past medical history. History reviewed. No pertinent past surgical history. History reviewed. No pertinent family history. Social History  Substance Use Topics  . Smoking status: Never Smoker   . Smokeless tobacco: None  . Alcohol Use: 21.6 oz/week    36 Cans of beer per week    Review of Systems  Constitutional: Negative for fever.  Cardiovascular: Negative for chest pain.  Gastrointestinal: Negative for vomiting.  Skin: Positive for rash.  All other systems reviewed and are negative.     Allergies  Review of patient's allergies indicates no known allergies.  Home Medications   Prior to Admission  medications   Medication Sig Start Date End Date Taking? Authorizing Provider  flintstones complete (FLINTSTONES) 60 MG chewable tablet Chew 1 tablet by mouth once a week.   Yes Historical Provider, MD   BP 131/100 mmHg  Pulse 93  Temp(Src) 97.5 F (36.4 C) (Oral)  Resp 16  SpO2 97% Physical Exam  Constitutional: He is oriented to person, place, and time. He appears well-developed and well-nourished.  Abrasion occipital scalp  HENT:  Head: Normocephalic.  Eyes: EOM are normal.  Neck: Normal range of motion.  Cardiovascular: Normal rate.   Pulmonary/Chest: Effort normal.  Abdominal: He exhibits no distension.  Musculoskeletal:  Abrasion right elbow  Neurological: He is alert and oriented to person, place, and time.  Skin: Skin is warm.  Psychiatric: He has a normal mood and affect.  Nursing note and vitals reviewed.   ED Course  Procedures (including critical care time) Labs Review Labs Reviewed  ETHANOL - Abnormal; Notable for the following:    Alcohol, Ethyl (B) 444 (*)    All other components within normal limits    Imaging Review Ct Head Wo Contrast  03/03/2016  CLINICAL DATA:  Fall. EXAM: CT HEAD WITHOUT CONTRAST CT CERVICAL SPINE WITHOUT CONTRAST TECHNIQUE: Multidetector CT imaging of the head and cervical spine was performed following the standard protocol without intravenous contrast. Multiplanar CT image reconstructions of the cervical spine were also generated. COMPARISON:  None. FINDINGS: CT HEAD FINDINGS Ventricles and sulci appear symmetrical. No ventricular dilatation. No mass effect or midline shift. No abnormal extra-axial fluid collections. Gray-white  matter junctions are distinct. Basal cisterns are not effaced. No evidence of acute intracranial hemorrhage. No depressed skull fractures. Visualized paranasal sinuses and mastoid air cells are not opacified. CT CERVICAL SPINE FINDINGS Normal alignment of the cervical spine. Mild degenerative changes at C5-6 and  C6-7 levels. No vertebral compression deformities. No prevertebral soft tissue swelling. C1-2 articulation appears intact. No focal bone lesion or bone destruction. Soft tissues are unremarkable. IMPRESSION: No acute intracranial abnormalities. Mild degenerative changes in the cervical spine. No acute displaced fractures identified. Electronically Signed   By: Burman Nieves M.D.   On: 03/03/2016 00:36   Ct Cervical Spine Wo Contrast  03/03/2016  CLINICAL DATA:  Fall. EXAM: CT HEAD WITHOUT CONTRAST CT CERVICAL SPINE WITHOUT CONTRAST TECHNIQUE: Multidetector CT imaging of the head and cervical spine was performed following the standard protocol without intravenous contrast. Multiplanar CT image reconstructions of the cervical spine were also generated. COMPARISON:  None. FINDINGS: CT HEAD FINDINGS Ventricles and sulci appear symmetrical. No ventricular dilatation. No mass effect or midline shift. No abnormal extra-axial fluid collections. Gray-white matter junctions are distinct. Basal cisterns are not effaced. No evidence of acute intracranial hemorrhage. No depressed skull fractures. Visualized paranasal sinuses and mastoid air cells are not opacified. CT CERVICAL SPINE FINDINGS Normal alignment of the cervical spine. Mild degenerative changes at C5-6 and C6-7 levels. No vertebral compression deformities. No prevertebral soft tissue swelling. C1-2 articulation appears intact. No focal bone lesion or bone destruction. Soft tissues are unremarkable. IMPRESSION: No acute intracranial abnormalities. Mild degenerative changes in the cervical spine. No acute displaced fractures identified. Electronically Signed   By: Burman Nieves M.D.   On: 03/03/2016 00:36   I have personally reviewed and evaluated these images and lab results as part of my medical decision-making.   EKG Interpretation None      MDM Alcohol is 444.  Ct scan head and c spine.  (Pt initially refused) Pt states he feels fine.     Final  diagnoses:  Intoxication  Abrasion of head, initial encounter   Pt's care turned over to Elpidio Anis at 1:00am.     Elson Areas, PA-C 03/03/16 0036  Lonia Skinner Clio, PA-C 03/03/16 1726  Gerhard Munch, MD 03/03/16 1758

## 2016-03-03 NOTE — ED Provider Notes (Signed)
Patient in ED after falling from his truck, hitting his head on the ground. He presents with acute intoxication with fall including head injury. No LOC, no vomiting. Patient's fall [place] by [employer] officer. ETOH 440, pending head and neck CT and sobering up. Questionably depressed - will need re-evaluation when sober.  3:45:  The patient has been awake, ambulatory, steady, oriented. He removed IV catheter without nursing assistance. He reports to nursing that he wants to be discharged.   The patient was not in the room when I went to re-evaluate, having left without informing staff.   Elpidio AnisShari Cherrie Franca, PA-C 03/03/16 0356  Laurence Spatesachel Morgan Little, MD 03/07/16 2005

## 2016-03-03 NOTE — ED Notes (Signed)
Pt up and walking to nurses station.  St's he is ready to go.  Pt pulled IV out with cath intact

## 2017-11-12 IMAGING — CT CT CERVICAL SPINE W/O CM
4 of 7 series · 14 of 33 positions shown, 16 images · non-contrast
Comparison: None.

CLINICAL DATA: Fall.

EXAM:
CT HEAD WITHOUT CONTRAST
CT CERVICAL SPINE WITHOUT CONTRAST
TECHNIQUE: Multidetector CT imaging of the head and cervical spine was
performed following the standard protocol without intravenous
contrast. Multiplanar CT image reconstructions of the cervical spine
were also generated.

[Series 202: head w/o bone, idose (1) · axial · non-contrast · 0.49mm/px · z∈[+609,+689]mm · 3 of 64 slices shown]
[im 16/64  bone]
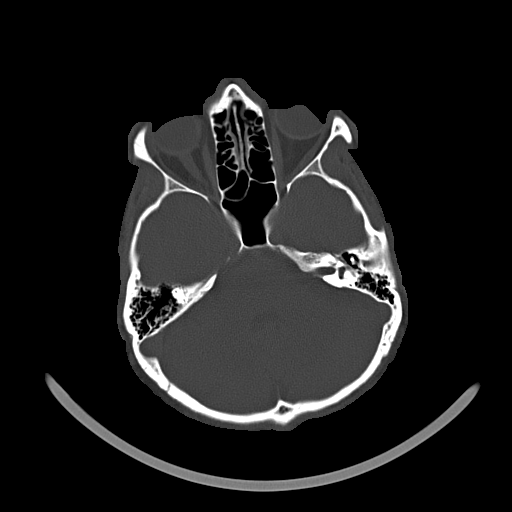
[im 32/64  bone]
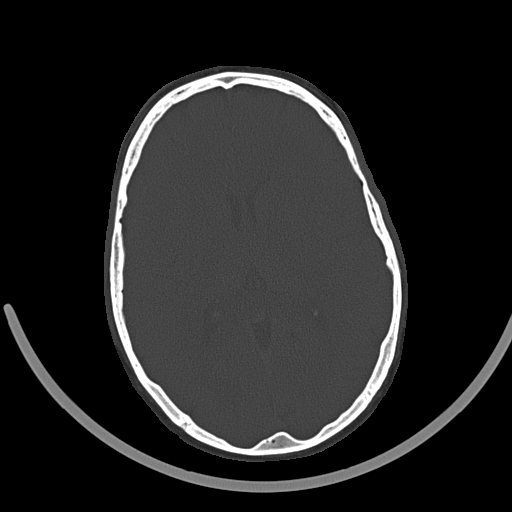
[im 48/64  bone]
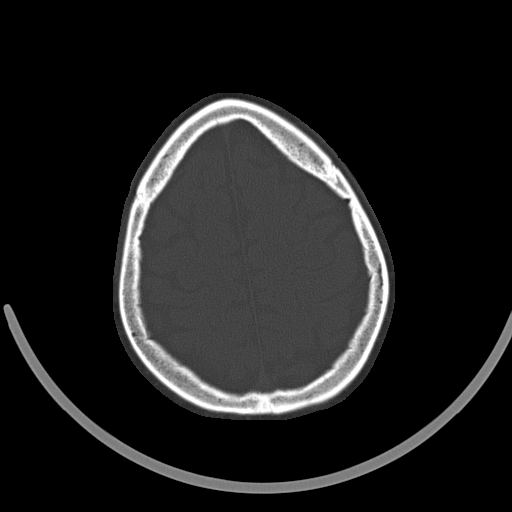

[Series 302: soft tissue, idose (2) · axial · 0.31mm/px · z∈[+460,+588]mm · 5 of 96 slices shown, 7 images]
[im 16/96  soft-tissue]
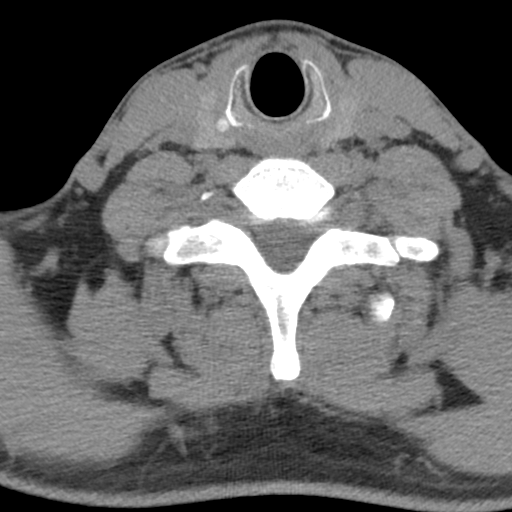
[im 16/96  bone]
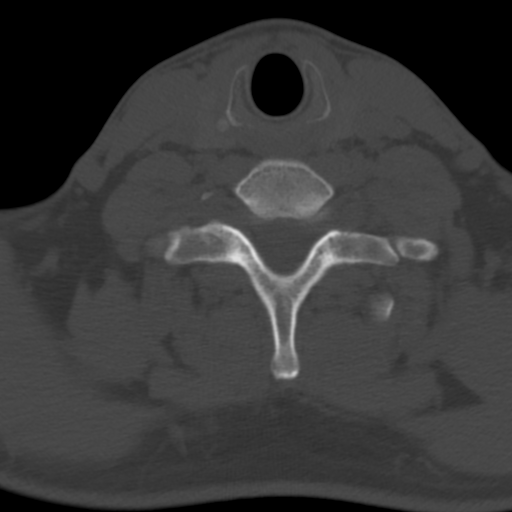
[im 32/96  bone]
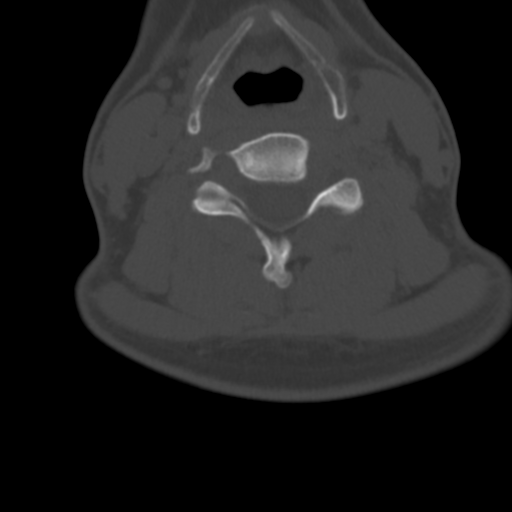
[im 48/96  bone]
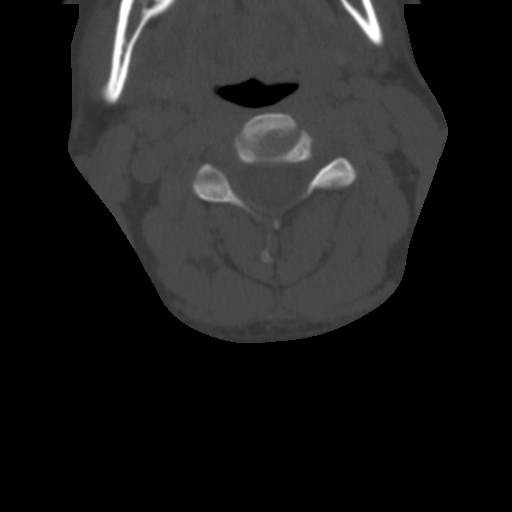
[im 64/96  bone]
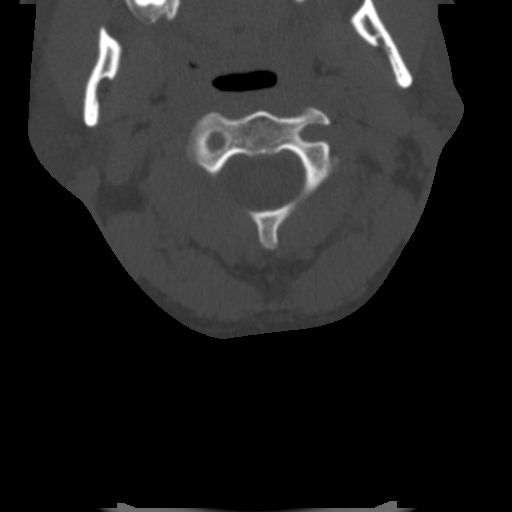
[im 80/96  soft-tissue]
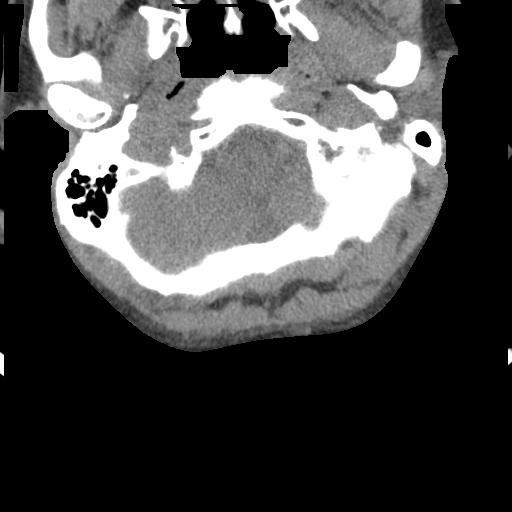
[im 80/96  bone]
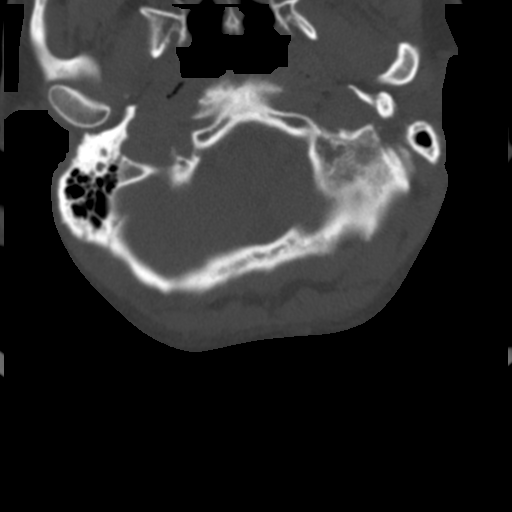

[Series 304: coronal, idose (2) · coronal · 0.32mm/px · 1 of 80 slices shown]
[im 40/80  bone]
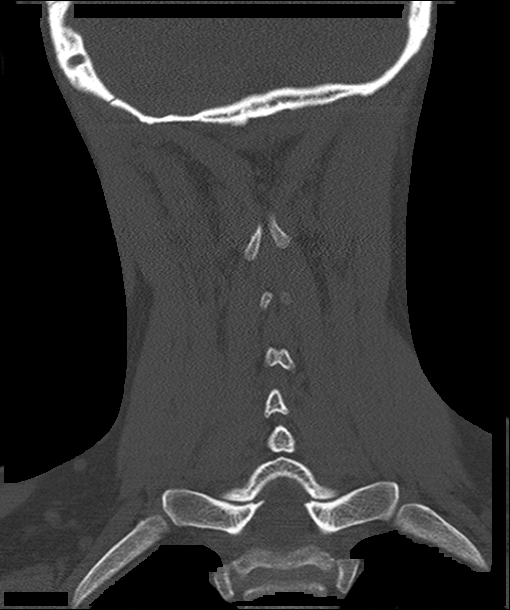

[Series 305: sagittal, idose (2) · sagittal · 0.32mm/px · 5 of 80 slices shown]
[im 14/80  bone]
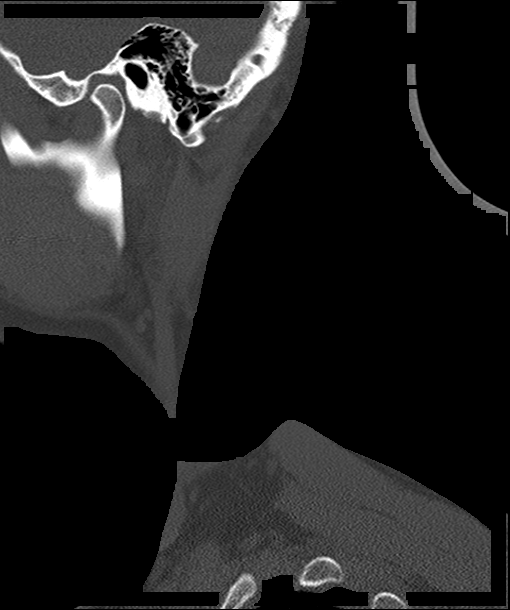
[im 27/80  bone]
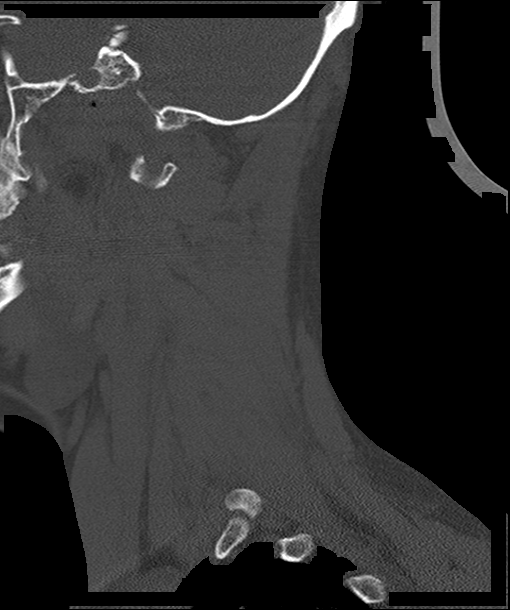
[im 40/80  bone]
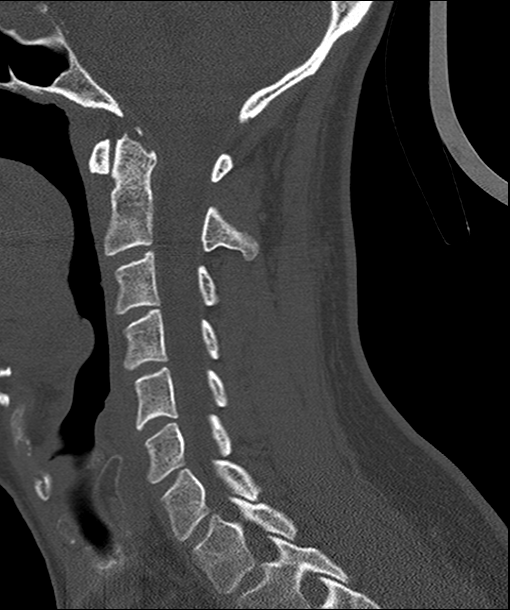
[im 53/80  bone]
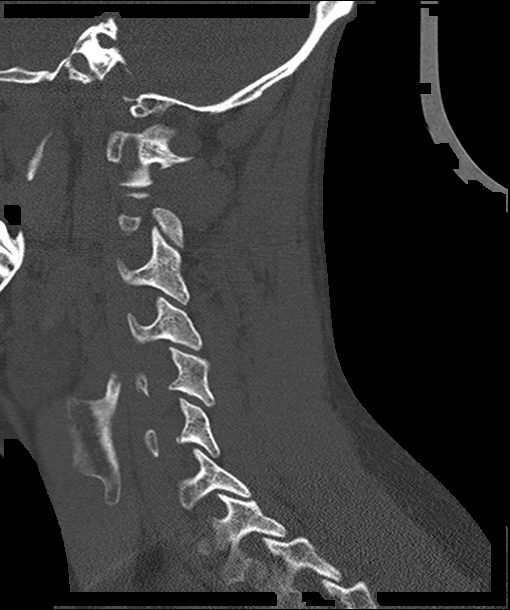
[im 66/80  bone]
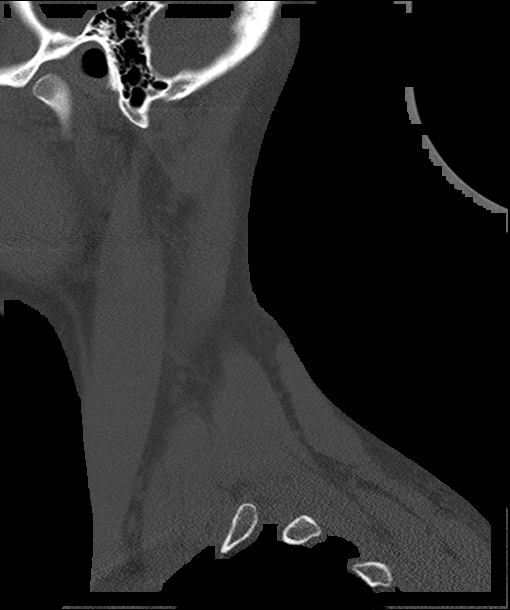

[14 of 33 positions shown; findings below may reference images not displayed]

FINDINGS: CT HEAD FINDINGS

Ventricles and sulci appear symmetrical. No ventricular dilatation.
No mass effect or midline shift. No abnormal extra-axial fluid
collections. Gray-white matter junctions are distinct. Basal
cisterns are not effaced. No evidence of acute intracranial
hemorrhage. No depressed skull fractures. Visualized paranasal
sinuses and mastoid air cells are not opacified.

CT CERVICAL SPINE FINDINGS

Normal alignment of the cervical spine. Mild degenerative changes at
C5-6 and C6-7 levels. No vertebral compression deformities. No
prevertebral soft tissue swelling. C1-2 articulation appears intact.
No focal bone lesion or bone destruction. Soft tissues are
unremarkable.
IMPRESSION: No acute intracranial abnormalities.

Mild degenerative changes in the cervical spine. No acute displaced
fractures identified.

## 2018-01-16 ENCOUNTER — Other Ambulatory Visit: Payer: Self-pay

## 2018-01-16 ENCOUNTER — Encounter: Payer: Self-pay | Admitting: Family Medicine

## 2018-01-16 ENCOUNTER — Ambulatory Visit: Payer: BLUE CROSS/BLUE SHIELD | Admitting: Family Medicine

## 2018-01-16 VITALS — BP 120/82 | HR 90 | Temp 98.2°F | Ht 69.0 in | Wt 158.2 lb

## 2018-01-16 DIAGNOSIS — K122 Cellulitis and abscess of mouth: Secondary | ICD-10-CM

## 2018-01-16 MED ORDER — DOXYCYCLINE HYCLATE 100 MG PO TABS: 100.0000 mg | ORAL_TABLET | Freq: Two times a day (BID) | ORAL | 0 refills | Status: AC

## 2018-01-16 NOTE — Patient Instructions (Addendum)
     IF you received an x-ray today, you will receive an invoice from [facility]. Please contact [facility] at [phone] with questions or concerns regarding your invoice.   IF you received labwork today, you will receive an invoice from [facility]. Please contact [facility] at [phone] with questions or concerns regarding your invoice.   Our billing staff will not be able to assist you with questions regarding bills from these companies.  You will be contacted with the lab results as soon as they are available. The fastest way to get your results is to activate your My Chart account. Instructions are located on the last page of this paperwork. If you have not heard from us regarding the results in 2 weeks, please contact this office.      Cellulitis, Adult Cellulitis is a skin infection. The infected area is usually red and sore. This condition occurs most often in the arms and lower legs. It is very important to get treated for this condition. Follow these instructions at home:  Take over-the-counter and prescription medicines only as told by your doctor.  If you were prescribed an antibiotic medicine, take it as told by your doctor. Do not stop taking the antibiotic even if you start to feel better.  Drink enough fluid to keep your pee (urine) clear or pale yellow.  Do not touch or rub the infected area.  Raise (elevate) the infected area above the level of your heart while you are sitting or lying down.  Place warm or cold wet cloths (warm or cold compresses) on the infected area. Do this as told by your doctor.  Keep all follow-up visits as told by your doctor. This is important. These visits let your doctor make sure your infection is not getting worse. Contact a doctor if:  You have a fever.  Your symptoms do not get better after 1-2 days of treatment.  Your bone or joint under the infected area starts to hurt after the skin has healed.  Your  infection comes back. This can happen in the same area or another area.  You have a swollen bump in the infected area.  You have new symptoms.  You feel ill and also have muscle aches and pains. Get help right away if:  Your symptoms get worse.  You feel very sleepy.  You throw up (vomit) or have watery poop (diarrhea) for a long time.  There are red streaks coming from the infected area.  Your red area gets larger.  Your red area turns darker. This information is not intended to replace advice given to you by your health care provider. Make sure you discuss any questions you have with your health care provider. Document Released: 04/05/2008 Document Revised: 03/25/2016 Document Reviewed: 08/27/2015 Elsevier Interactive Patient Education  2018 [employer]  

## 2018-01-16 NOTE — Progress Notes (Signed)
   3/18/20199:09 AM  Brandon Hayes 08/29/1981, 37 y.o. male [id number]  Chief Complaint  Patient presents with  . Mass    started as a bump aboe the lip. After squeezing what he thought was a pimple, started to swell. Very sore    HPI:   Patient is a 37 y.o. male who presents today for right upper swollen lip that started this morning. Yesterday he had a pimple there that he tried to squeeze but it felt deeper than what he thought so not much came out and he left it alone. Then this morning right lip was swollen, tender and slightly red. No fever or chills, no swollen glands, no teeth pain.  Depression screen PHQ 2/9 01/16/2018  Decreased Interest 0  Down, Depressed, Hopeless 0  PHQ - 2 Score 0    No Known Allergies  Prior to Admission medications   Medication Sig Start Date End Date Taking? Authorizing Provider    History reviewed. No pertinent past medical history.  History reviewed. No pertinent surgical history.  Social History   Tobacco Use  . Smoking status: Never Smoker  . Smokeless tobacco: Never Used  Substance Use Topics  . Alcohol use: Yes    Alcohol/week: 21.6 oz    Types: 36 Cans of beer per week    Family History  Problem Relation Age of Onset  . Healthy Mother   . Healthy Father   . Healthy Brother     ROS Per hpi  OBJECTIVE:  Blood pressure 120/82, pulse 90, temperature 98.2 F (36.8 C), temperature source Oral, height 5\' 9"  (1.753 m), weight 158 lb 3.2 oz (71.8 kg), SpO2 100 %.  Physical Exam  Constitutional: He is well-developed, well-nourished, and in no distress.  HENT:  Head: Normocephalic and atraumatic.  Nose: Nose normal.  Mouth/Throat: Oropharynx is clear and moist. No dental abscesses.    Eyes: Conjunctivae and EOM are normal. Pupils are equal, round, and reactive to light.  Neck: Neck supple.  Pulmonary/Chest: He has wheezes.  Lymphadenopathy:    He has no cervical adenopathy.    ASSESSMENT and PLAN  1. Cellulitis  of mouth Discussed supportive measures, new meds r/se/b and RTC precautions. Patient educational handout given. Other orders - doxycycline (VIBRA-TABS) 100 MG tablet; Take 1 tablet (100 mg total) by mouth 2 (two) times daily.  Return in about 3 days (around 01/19/2018).    Myles LippsIrma M Santiago, MD [facility] [address] [place], [place] [id number] Ph.  [phone] Fax [phone]

## 2018-01-20 ENCOUNTER — Ambulatory Visit: Payer: BLUE CROSS/BLUE SHIELD | Admitting: Family Medicine
